# Patient Record
Sex: Male | Born: 1965 | Race: White | Hispanic: No | Marital: Married | Smoking: Never smoker
Health system: Southern US, Community
[De-identification: ages and names within clinical notes are randomized; demographics above are authoritative.]

---

## 2000-06-09 ENCOUNTER — Encounter: Payer: Self-pay | Admitting: Family Medicine

## 2000-06-09 ENCOUNTER — Encounter: Admission: RE | Admit: 2000-06-09 | Discharge: 2000-06-09 | Payer: Self-pay | Admitting: Family Medicine

## 2001-04-12 ENCOUNTER — Emergency Department (HOSPITAL_COMMUNITY): Admission: EM | Admit: 2001-04-12 | Discharge: 2001-04-12 | Payer: Self-pay | Admitting: Emergency Medicine

## 2002-09-15 ENCOUNTER — Encounter: Payer: Self-pay | Admitting: Family Medicine

## 2002-09-15 ENCOUNTER — Ambulatory Visit (HOSPITAL_COMMUNITY): Admission: RE | Admit: 2002-09-15 | Discharge: 2002-09-15 | Payer: Self-pay | Admitting: Family Medicine

## 2003-08-09 ENCOUNTER — Encounter: Admission: RE | Admit: 2003-08-09 | Discharge: 2003-08-09 | Payer: Self-pay | Admitting: Cardiology

## 2005-08-27 IMAGING — CR DG CHEST 2V
2 series · 2 of 2 positions shown · non-contrast
Comparison: none

CLINICAL DATA: Chest pain.
 CHEST X-RAY
 The heart size and mediastinal contours are unremarkable.  The lungs are clear.  The visualized skeleton is unremarkable.
 IMPRESSION
 No active lung disease.

[view not recorded (1 of 2)]
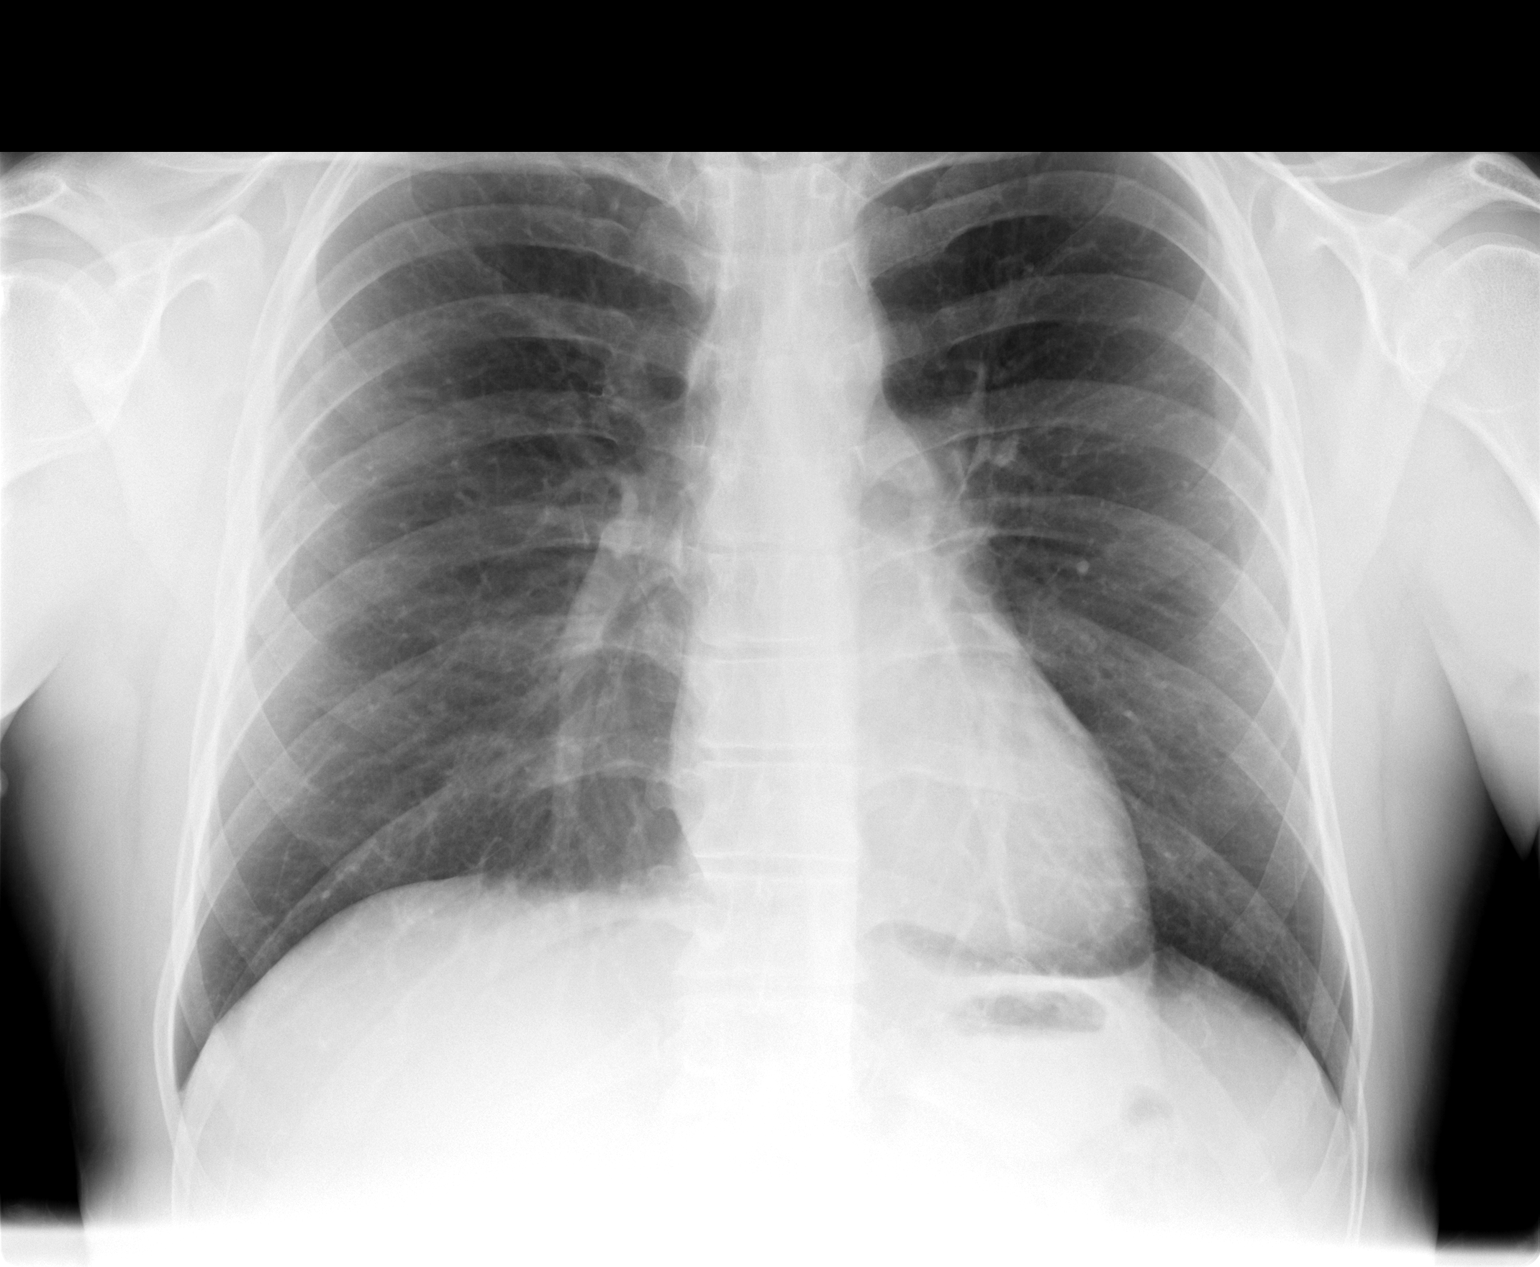

[view not recorded (2 of 2)]
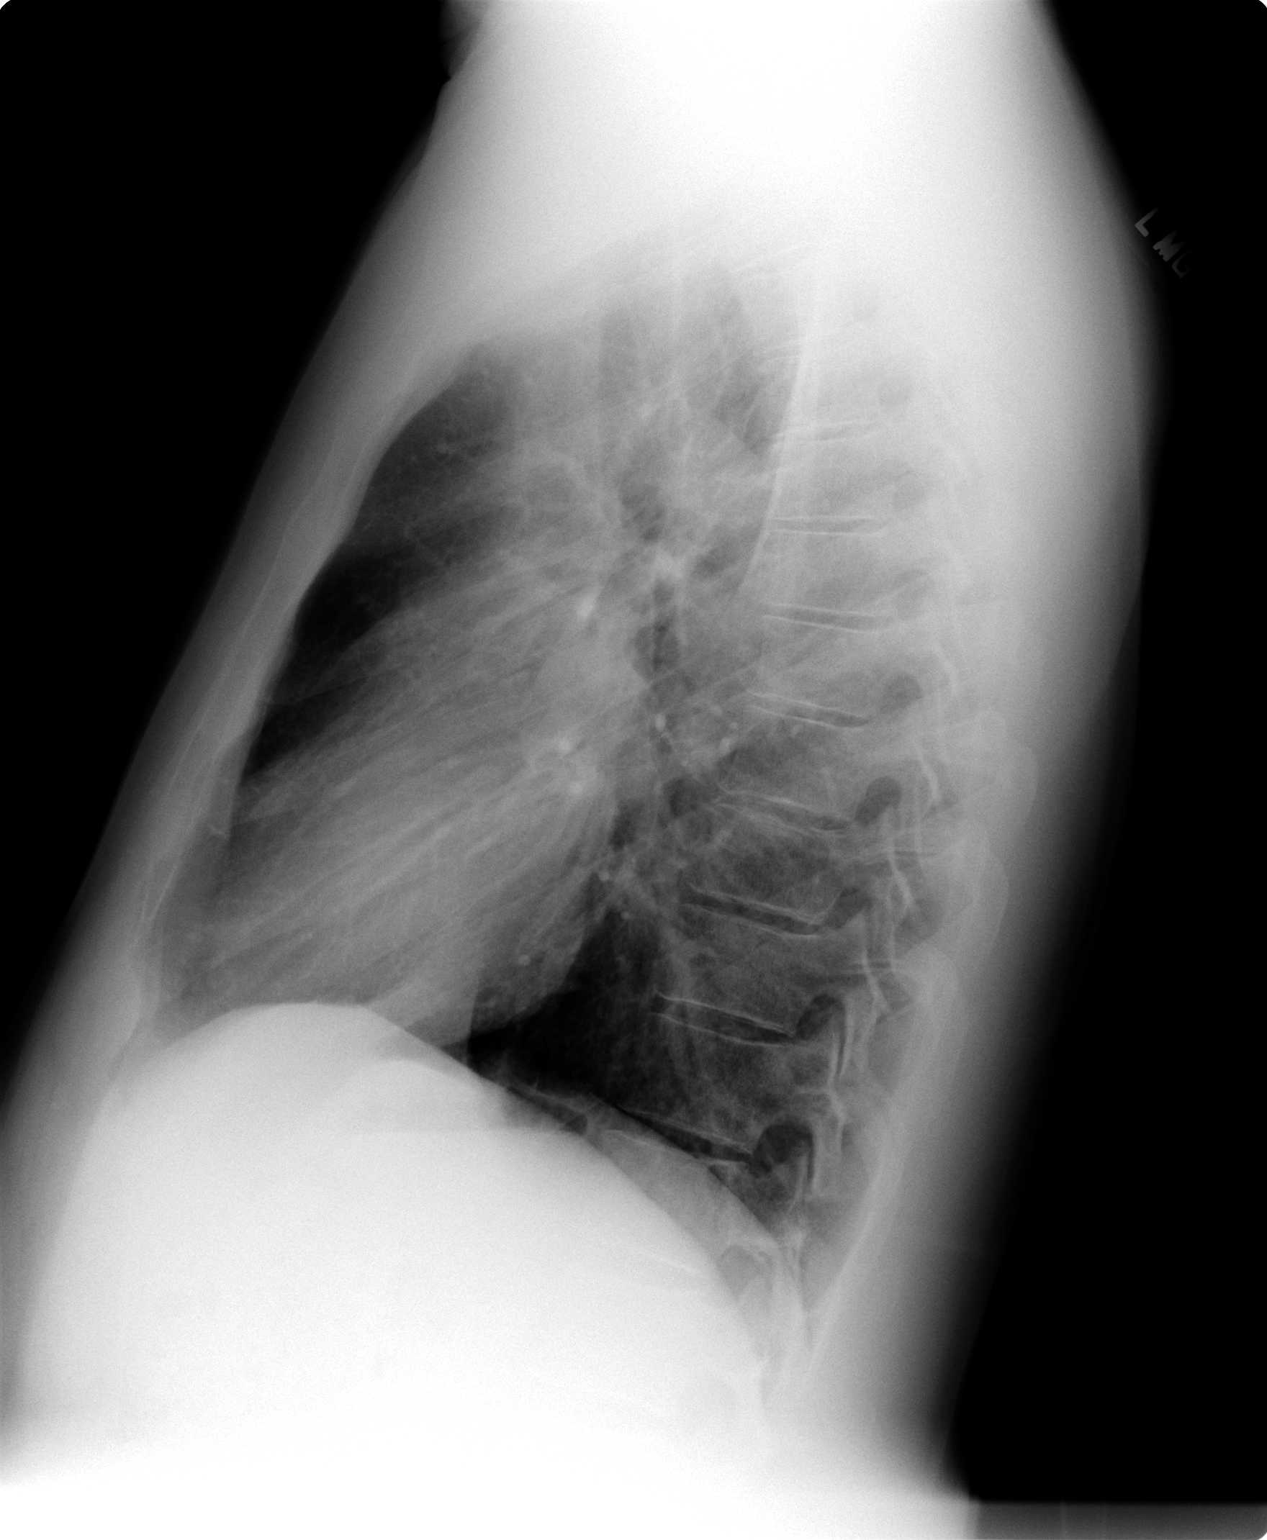

[2 of 2 positions shown; findings below may reference images not displayed]

## 2006-06-27 ENCOUNTER — Ambulatory Visit: Payer: Self-pay | Admitting: Gastroenterology

## 2006-07-02 ENCOUNTER — Ambulatory Visit: Payer: Self-pay | Admitting: Gastroenterology

## 2006-07-02 ENCOUNTER — Encounter: Payer: Self-pay | Admitting: Gastroenterology

## 2006-08-15 ENCOUNTER — Ambulatory Visit: Payer: Self-pay | Admitting: Gastroenterology

## 2007-07-18 DIAGNOSIS — K449 Diaphragmatic hernia without obstruction or gangrene: Secondary | ICD-10-CM | POA: Insufficient documentation

## 2007-07-18 DIAGNOSIS — J329 Chronic sinusitis, unspecified: Secondary | ICD-10-CM | POA: Insufficient documentation

## 2007-07-18 DIAGNOSIS — F17200 Nicotine dependence, unspecified, uncomplicated: Secondary | ICD-10-CM | POA: Insufficient documentation

## 2007-07-18 DIAGNOSIS — K589 Irritable bowel syndrome without diarrhea: Secondary | ICD-10-CM | POA: Insufficient documentation

## 2007-07-18 DIAGNOSIS — K219 Gastro-esophageal reflux disease without esophagitis: Secondary | ICD-10-CM | POA: Insufficient documentation

## 2007-07-18 DIAGNOSIS — J309 Allergic rhinitis, unspecified: Secondary | ICD-10-CM | POA: Insufficient documentation

## 2007-07-18 DIAGNOSIS — K222 Esophageal obstruction: Secondary | ICD-10-CM | POA: Insufficient documentation

## 2007-11-06 ENCOUNTER — Telehealth: Payer: Self-pay | Admitting: Gastroenterology

## 2008-01-11 ENCOUNTER — Telehealth: Payer: Self-pay | Admitting: Gastroenterology

## 2008-01-26 ENCOUNTER — Ambulatory Visit: Payer: Self-pay | Admitting: Gastroenterology

## 2010-07-20 NOTE — Assessment & Plan Note (Signed)
[facility]                         GASTROENTEROLOGY OFFICE NOTE   NAME:Opheim, ELIN SEATS                      MRN:          [id number]  DATE:06/27/2006                            DOB:          10-01-1965    Mr. Gillie is a 45 year old white male who is having chronic gas,  bloating, belching, and burping on and off since 2005.  He saw Dr. Victorino Dike in 2005 and was felt to have irritable bowel syndrome, after  ultrasound was negative and stool exams were normal.  At that time he  was having some diarrhea.  His symptoms seemed to go away and returned  within the last few weeks.  His main complaint is one of belching  without acid reflux symptoms or dysphagia.  His belches have an awful  smell and only occur during the day.  He has not tried antacids, H2  blockers or PPI therapy.  He denies any hepatobiliary complaints,  anorexia or weight loss.  He is having regular bowel movements now  without melena or hematochezia.  He has kept a food diary and does not  have any specific food intolerances.  He denies the abuse of alcohol,  cigarettes, or sodas.  He does eat very irregularly however.  He has had  no anorexia or weight loss.  Previous ultrasound exam was apparently  normal although I do not have this report.   PAST MEDICAL HISTORY:  Otherwise is entirely normal except for chronic  sinusitis and postnasal drip.   MEDICATIONS:  None.   ALLERGIES:  None.   FAMILY HISTORY:  Noncontributory.   SOCIAL HISTORY:  He is married and lives with his wife and 2 children.  He has a high school education and works as a Psychologist, occupational.  He does not smoke  and does not abuse ethanol.   REVIEW OF SYSTEMS:  Entirely non-contributory without any symptoms of  collagen vascular disease and no systemic complaints.  He feels well and  has no chronic fatigue.   He is a healthy appearing white male in no distress, appearing his  stated age.  I cannot appreciate stigmata  of chronic liver disease.  He is 6 feet tall and weighs 194 pounds.  Blood pressure 130/86, pulse  was 80 and regular.  I could not appreciate thyromegaly  CHEST:  Was clear.  He is in a regular rhythm without murmurs, gallops or rubs.  I could not appreciate hepatosplenomegaly, abdominal masses or  tenderness.  Bowel sounds were normal.  EXTREMITIES: Were unremarkable.  Mental status was normal.  RECTAL:  Exam was deferred.   ASSESSMENT:  Mr. Stubblefield has aerophagia and excessive belching of  unexplained etiology.  He probably has a hiatal hernia, probably has  occult gastroesophageal reflux disease.   RECOMMENDATIONS:  I am going to go ahead and set [facility] up for endoscopy  and proceed accordingly. Should this be unremarkable, will proceed with  technetium gastric emptying scan.  I have given him some information  concerning gas and its management in the interim     Vania Rea. Jarold Motto, MD, Caleen Essex, FAGA  Electronically Signed    DRP/MedQ  DD: 06/27/2006  DT: 06/27/2006  Job #: [id number]   cc:   Molly Maduro A. Nicholos Johns, M.D.

## 2013-08-08 ENCOUNTER — Ambulatory Visit: Payer: BC Managed Care – PPO

## 2013-08-08 ENCOUNTER — Ambulatory Visit (INDEPENDENT_AMBULATORY_CARE_PROVIDER_SITE_OTHER): Payer: BC Managed Care – PPO | Admitting: Family Medicine

## 2013-08-08 VITALS — BP 132/82 | HR 92 | Temp 99.2°F | Resp 16 | Ht 72.0 in | Wt 215.0 lb

## 2013-08-08 DIAGNOSIS — M25532 Pain in left wrist: Secondary | ICD-10-CM

## 2013-08-08 DIAGNOSIS — M25539 Pain in unspecified wrist: Secondary | ICD-10-CM

## 2013-08-08 DIAGNOSIS — S62102A Fracture of unspecified carpal bone, left wrist, initial encounter for closed fracture: Secondary | ICD-10-CM

## 2013-08-08 DIAGNOSIS — S62109A Fracture of unspecified carpal bone, unspecified wrist, initial encounter for closed fracture: Secondary | ICD-10-CM

## 2013-08-08 NOTE — Progress Notes (Signed)
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 He's had pain and some swelling ever since with difficulty moving the wrist at all. He says he can move his fingers without problem however. There are  There are no other complaints. No loss of consciousness or other injury. Patient is managing well controlling the pain with ibuprofen the  Objective: Alert and in no acute distress. Patient had bought a wrist splint and takes it off for inspection. Patient is unable to completely supinate the wrist or dorsi or palmar flex the wrist. The There is some mild swelling along the joint line of the left wrist with no ecchymosis or point tenderness.  [facility] reading (PRIMARY) by  Dr. Milus Glazier left wrist:.ND left wrist fx  Wrist pain, left - Plan: DG Wrist Complete Left, Ambulatory referral to Orthopedic Surgery  Wrist fracture, left - Plan: Ambulatory referral to Orthopedic Surgery  Signed, Elvina Sidle, MD

## 2015-08-27 IMAGING — CR DG WRIST COMPLETE 3+V*L*
2 series · 2 of 2 positions shown · non-contrast
Comparison: None.

CLINICAL DATA: Pain post trauma

EXAM:
LEFT WRIST - COMPLETE 3+ VIEW

[PA]
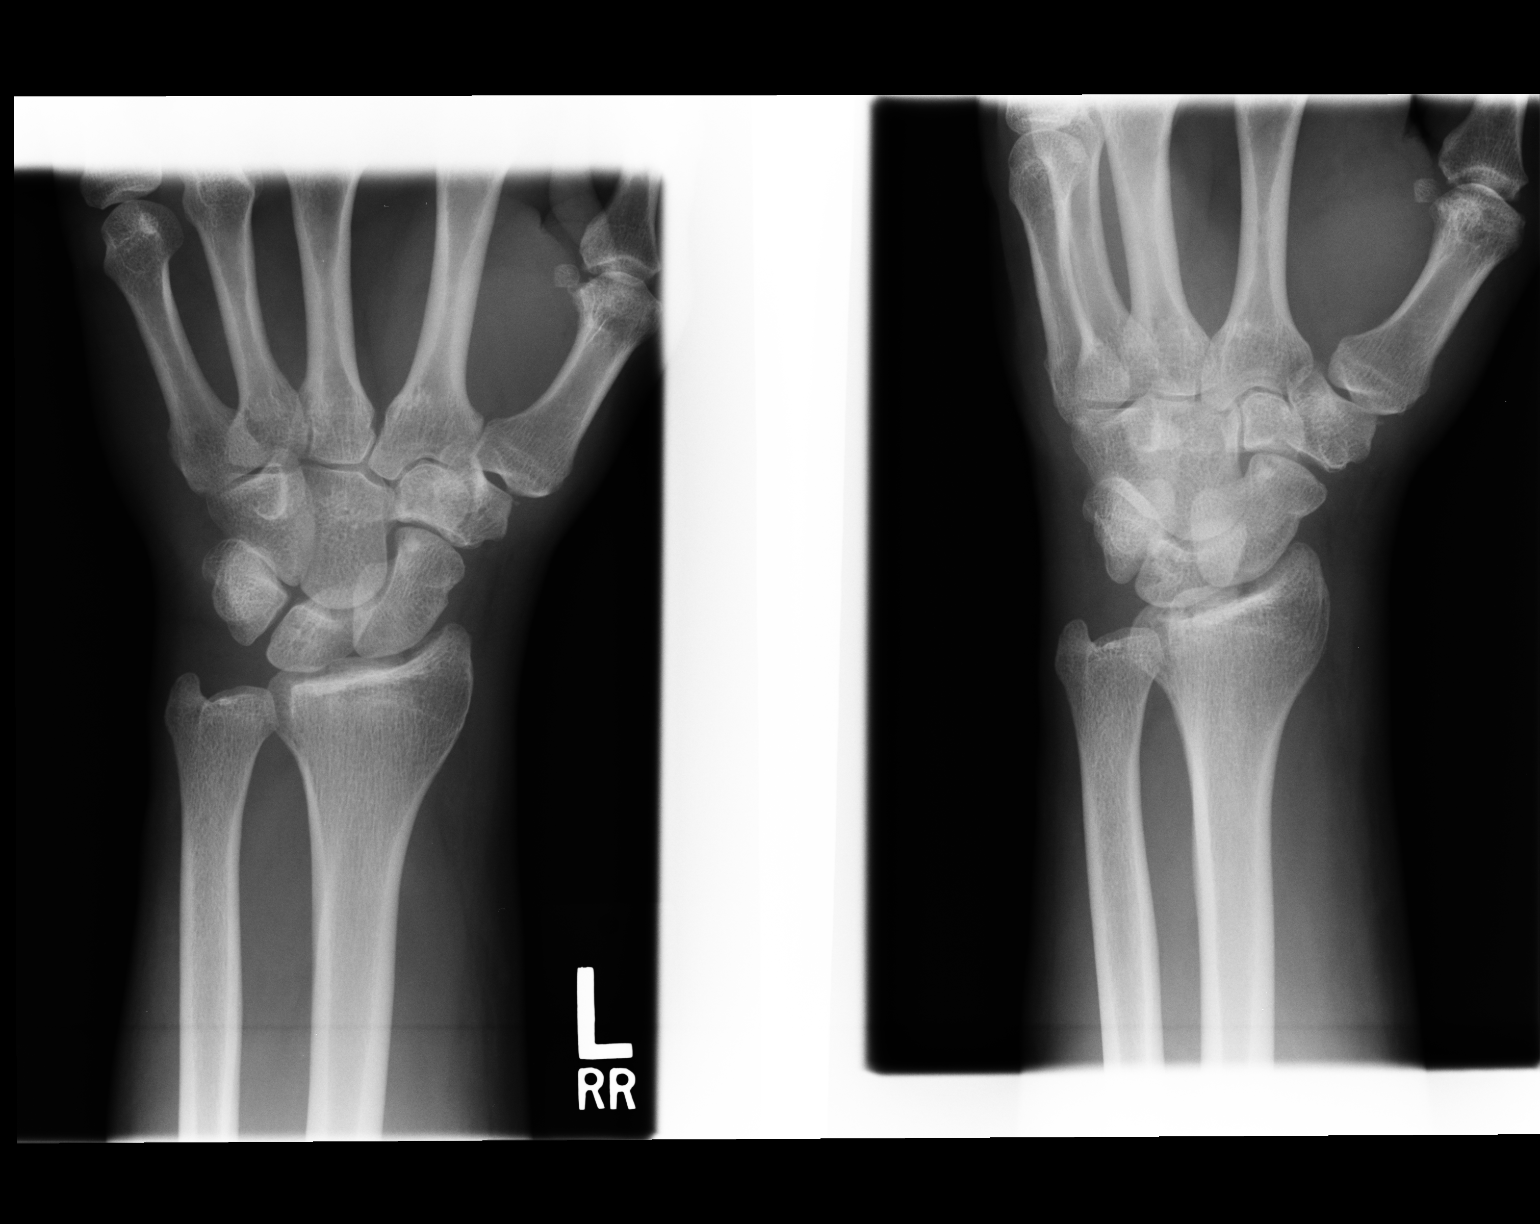

[lateral]
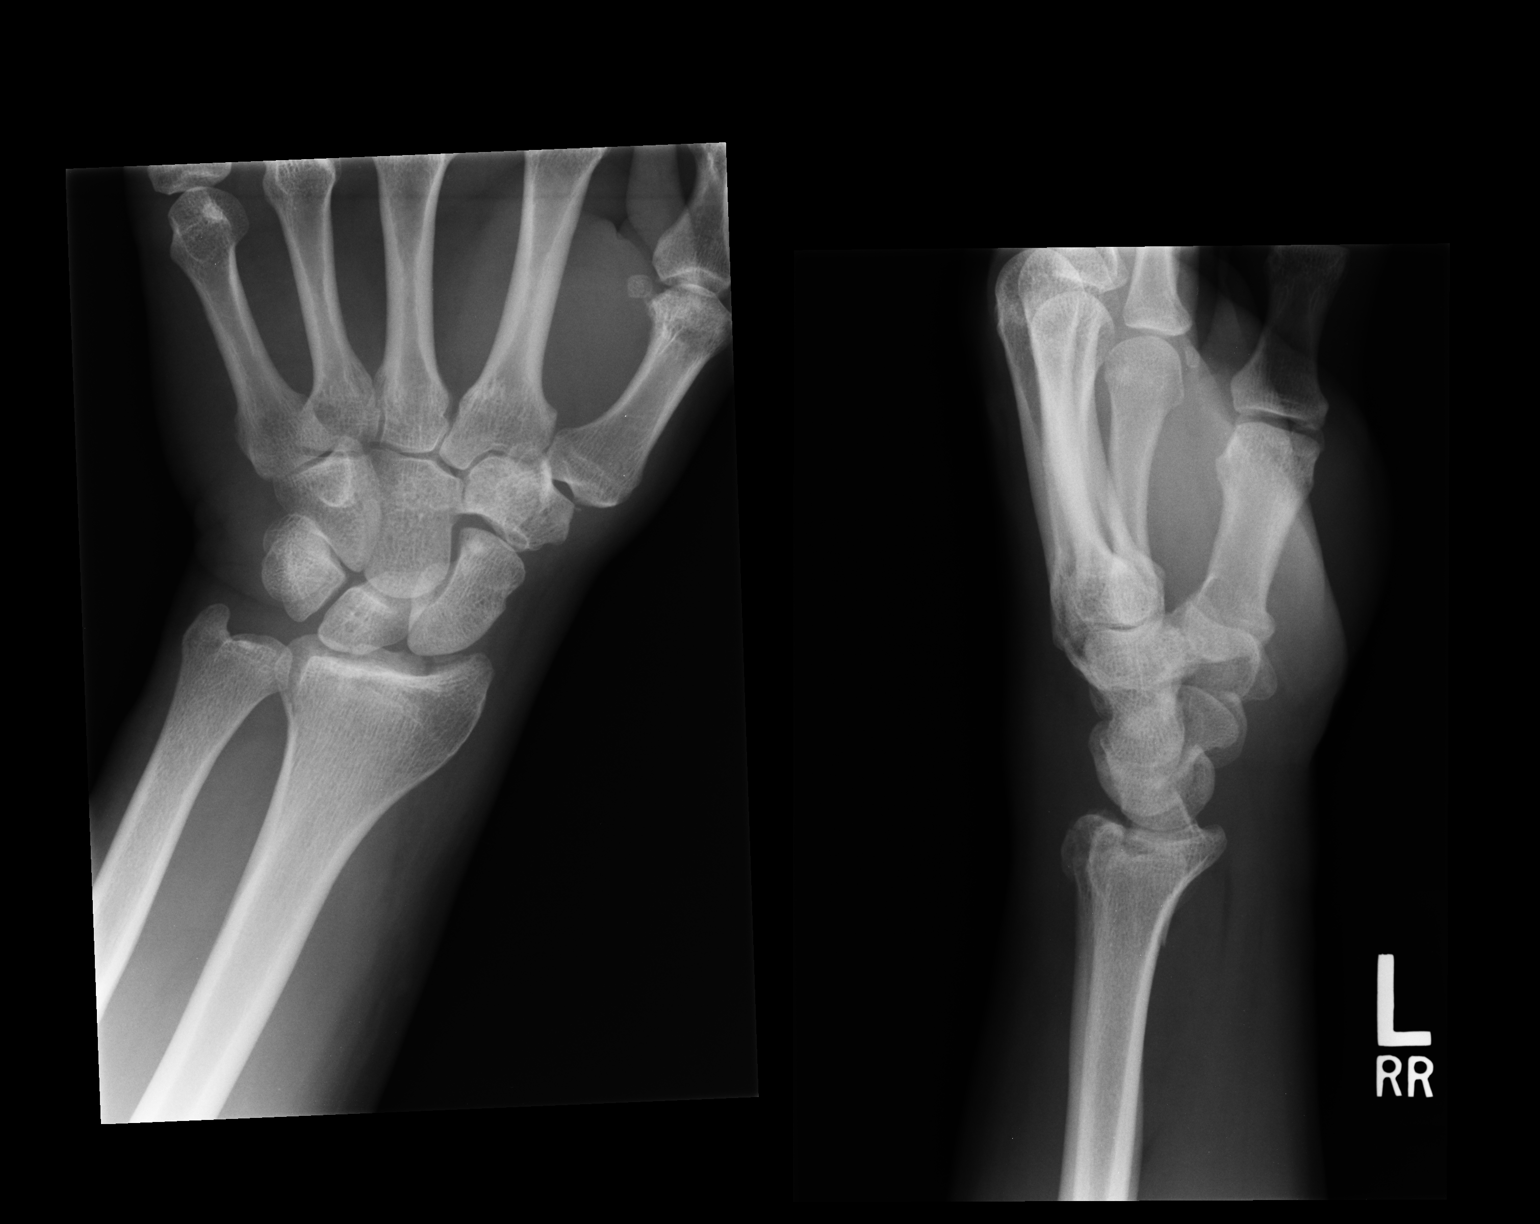

[2 of 2 positions shown; findings below may reference images not displayed]

FINDINGS: Frontal, lateral, oblique, and ulnar deviation scaphoid views were
obtained. There is a somewhat complex fracture along the dorsal
distal radial epiphysis extending into the radiocarpal joint along
the dorsal aspect. Alignment is overall near anatomic. No other
fractures. No dislocation. Joint spaces appear intact.
IMPRESSION: Complex distal radial fracture along the dorsal aspect with fracture
fragment extending into the radiocarpal joint. Alignment is near
anatomic. No dislocation.

## 2016-06-28 DIAGNOSIS — R7303 Prediabetes: Secondary | ICD-10-CM | POA: Diagnosis not present

## 2016-06-28 DIAGNOSIS — Z125 Encounter for screening for malignant neoplasm of prostate: Secondary | ICD-10-CM | POA: Diagnosis not present

## 2016-06-28 DIAGNOSIS — Z0000 Encounter for general adult medical examination without abnormal findings: Secondary | ICD-10-CM | POA: Diagnosis not present

## 2016-06-28 DIAGNOSIS — E7800 Pure hypercholesterolemia, unspecified: Secondary | ICD-10-CM | POA: Diagnosis not present

## 2017-11-10 DIAGNOSIS — I10 Essential (primary) hypertension: Secondary | ICD-10-CM | POA: Diagnosis not present

## 2017-11-10 DIAGNOSIS — K219 Gastro-esophageal reflux disease without esophagitis: Secondary | ICD-10-CM | POA: Diagnosis not present

## 2017-11-10 DIAGNOSIS — R7303 Prediabetes: Secondary | ICD-10-CM | POA: Diagnosis not present

## 2017-11-10 DIAGNOSIS — E7800 Pure hypercholesterolemia, unspecified: Secondary | ICD-10-CM | POA: Diagnosis not present

## 2019-05-07 DIAGNOSIS — I10 Essential (primary) hypertension: Secondary | ICD-10-CM | POA: Diagnosis not present

## 2019-05-07 DIAGNOSIS — K219 Gastro-esophageal reflux disease without esophagitis: Secondary | ICD-10-CM | POA: Diagnosis not present

## 2019-05-07 DIAGNOSIS — E7800 Pure hypercholesterolemia, unspecified: Secondary | ICD-10-CM | POA: Diagnosis not present

## 2019-05-07 DIAGNOSIS — R7303 Prediabetes: Secondary | ICD-10-CM | POA: Diagnosis not present

## 2019-08-03 DIAGNOSIS — Z03818 Encounter for observation for suspected exposure to other biological agents ruled out: Secondary | ICD-10-CM | POA: Diagnosis not present

## 2019-08-06 DIAGNOSIS — K5730 Diverticulosis of large intestine without perforation or abscess without bleeding: Secondary | ICD-10-CM | POA: Diagnosis not present

## 2019-08-06 DIAGNOSIS — K640 First degree hemorrhoids: Secondary | ICD-10-CM | POA: Diagnosis not present

## 2019-08-06 DIAGNOSIS — K635 Polyp of colon: Secondary | ICD-10-CM | POA: Diagnosis not present

## 2019-08-06 DIAGNOSIS — D125 Benign neoplasm of sigmoid colon: Secondary | ICD-10-CM | POA: Diagnosis not present

## 2019-08-06 DIAGNOSIS — Z1211 Encounter for screening for malignant neoplasm of colon: Secondary | ICD-10-CM | POA: Diagnosis not present

## 2021-04-24 DIAGNOSIS — M25511 Pain in right shoulder: Secondary | ICD-10-CM | POA: Diagnosis not present

## 2021-06-21 DIAGNOSIS — R399 Unspecified symptoms and signs involving the genitourinary system: Secondary | ICD-10-CM | POA: Diagnosis not present

## 2021-06-21 DIAGNOSIS — E781 Pure hyperglyceridemia: Secondary | ICD-10-CM | POA: Diagnosis not present

## 2021-06-21 DIAGNOSIS — Z125 Encounter for screening for malignant neoplasm of prostate: Secondary | ICD-10-CM | POA: Diagnosis not present

## 2021-06-21 DIAGNOSIS — Z0000 Encounter for general adult medical examination without abnormal findings: Secondary | ICD-10-CM | POA: Diagnosis not present

## 2021-06-21 DIAGNOSIS — K219 Gastro-esophageal reflux disease without esophagitis: Secondary | ICD-10-CM | POA: Diagnosis not present

## 2021-06-21 DIAGNOSIS — R7303 Prediabetes: Secondary | ICD-10-CM | POA: Diagnosis not present

## 2022-06-27 DIAGNOSIS — Z0000 Encounter for general adult medical examination without abnormal findings: Secondary | ICD-10-CM | POA: Diagnosis not present

## 2022-06-27 DIAGNOSIS — R7303 Prediabetes: Secondary | ICD-10-CM | POA: Diagnosis not present

## 2022-06-27 DIAGNOSIS — E782 Mixed hyperlipidemia: Secondary | ICD-10-CM | POA: Diagnosis not present

## 2022-06-27 DIAGNOSIS — Z125 Encounter for screening for malignant neoplasm of prostate: Secondary | ICD-10-CM | POA: Diagnosis not present

## 2023-07-02 DIAGNOSIS — H04123 Dry eye syndrome of bilateral lacrimal glands: Secondary | ICD-10-CM | POA: Diagnosis not present

## 2023-07-02 DIAGNOSIS — Z13220 Encounter for screening for lipoid disorders: Secondary | ICD-10-CM | POA: Diagnosis not present

## 2023-07-02 DIAGNOSIS — Z0000 Encounter for general adult medical examination without abnormal findings: Secondary | ICD-10-CM | POA: Diagnosis not present

## 2023-07-02 DIAGNOSIS — R7303 Prediabetes: Secondary | ICD-10-CM | POA: Diagnosis not present

## 2023-07-02 DIAGNOSIS — H9313 Tinnitus, bilateral: Secondary | ICD-10-CM | POA: Diagnosis not present

## 2023-07-02 DIAGNOSIS — Z125 Encounter for screening for malignant neoplasm of prostate: Secondary | ICD-10-CM | POA: Diagnosis not present

## 2023-07-10 ENCOUNTER — Encounter (INDEPENDENT_AMBULATORY_CARE_PROVIDER_SITE_OTHER): Payer: Self-pay | Admitting: Otolaryngology

## 2023-07-30 ENCOUNTER — Encounter (INDEPENDENT_AMBULATORY_CARE_PROVIDER_SITE_OTHER): Payer: Self-pay | Admitting: Otolaryngology

## 2023-09-11 DIAGNOSIS — M542 Cervicalgia: Secondary | ICD-10-CM | POA: Diagnosis not present

## 2023-10-06 ENCOUNTER — Ambulatory Visit (INDEPENDENT_AMBULATORY_CARE_PROVIDER_SITE_OTHER): Payer: Self-pay | Admitting: Audiology

## 2023-10-06 ENCOUNTER — Ambulatory Visit (INDEPENDENT_AMBULATORY_CARE_PROVIDER_SITE_OTHER): Payer: Self-pay | Admitting: Otolaryngology

## 2023-10-06 ENCOUNTER — Encounter (INDEPENDENT_AMBULATORY_CARE_PROVIDER_SITE_OTHER): Payer: Self-pay | Admitting: Otolaryngology

## 2023-10-06 VITALS — BP 145/89 | HR 74

## 2023-10-06 DIAGNOSIS — H903 Sensorineural hearing loss, bilateral: Secondary | ICD-10-CM | POA: Diagnosis not present

## 2023-10-06 DIAGNOSIS — H9313 Tinnitus, bilateral: Secondary | ICD-10-CM | POA: Diagnosis not present

## 2023-10-06 NOTE — Progress Notes (Signed)
  [address], [address] [place], [place] [address] [phone]  Audiological Evaluation    Name: Paul Trujillo     DOB:   30-Aug-1965      MRN:   [id number]                                                                                     Service Date: 10/06/2023     Accompanied by: unaccompanied   Patient comes today after Dr. Soldatova, ENT sent a referral for a hearing evaluation due to concerns with tinnitus.   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 Left ear:  Clear external ear canal and notable landmarks visualized on the tympanic membrane.  Tympanometry: Right ear: Type A- Normal external ear canal volume with normal middle ear pressure and tympanic membrane compliance. Left ear: Type A- Normal external ear canal volume with normal middle ear pressure and tympanic membrane compliance.    Pure tone Audiometry: Both ears: Normal to moderate sensorineural hearing loss from 125 Hz - 8000 Hz , with a high frequency hearing loss notch.  Speech Audiometry: Right ear- Speech Reception Threshold (SRT) was obtained at 15 dBHL. Left ear-Speech Reception Threshold (SRT) was obtained at 15 dBHL.   Word Recognition Score Tested using NU-6 (recorded) Right ear: 100% was obtained at a presentation level of 60 dBHL with contralateral masking which is deemed as  excellent. Left ear: 100% was obtained at a presentation level of 60 dBHL with contralateral masking which is deemed as  excellent.   The hearing test results were  completed under headphones and results are deemed to be of good reliability. Test technique:  conventional      Recommendations: Follow up with ENT as scheduled for today. Return for a hearing evaluation if concerns with hearing changes arise or per MD recommendation. Use hearing protection when exposed to loud/damaging sounds.    Emmaclaire Switala MARIE LEROUX-MARTINEZ, AUD

## 2023-10-06 NOTE — Progress Notes (Signed)
 ENT CONSULT:  Reason for Consult: bilateral tinnitus    HPI: 89 yoM with hx of noise exposure at work over the years and while attending concerts, who presents with b/l tinnitus described as ringing. No ear pain or difficulties with communication aside from situations with a lot of background noise. No ear surgeries, no ear infections. No ear fullness.   History reviewed. No pertinent family history.  Social History:  reports that he has never smoked. He has never used smokeless tobacco. He reports that he does not drink alcohol and does not use drugs.  Allergies: No Known Allergies  Medications: I have reviewed the patient's current medications.  The PMH, PSH, Medications, Allergies, and SH were reviewed and updated.  ROS: Constitutional: Negative for fever, weight loss and weight gain. Cardiovascular: Negative for chest pain and dyspnea on exertion. Respiratory: Is not experiencing shortness of breath at rest. Gastrointestinal: Negative for nausea and vomiting. Neurological: Negative for headaches. Psychiatric: The patient is not nervous/anxious  Blood pressure (!) 145/89, pulse 74, SpO2 96%.  PHYSICAL EXAM:  Exam: General: Well-developed, well-nourished Respiratory Respiratory effort: Equal inspiration and expiration without stridor Cardiovascular Peripheral Vascular: Warm extremities with equal color/perfusion Eyes: No nystagmus with equal extraocular motion bilaterally Neuro/Psych/Balance: Patient oriented to person, place, and time; Appropriate mood and affect; Gait is intact with no imbalance; Cranial nerves I-XII are intact Head and Face Inspection: Normocephalic and atraumatic without mass or lesion Palpation: Facial skeleton intact without bony stepoffs Salivary Glands: No mass or tenderness Facial Strength: Facial motility symmetric and full bilaterally ENT Pinna: External ear intact and fully developed External canal: Canal is patent with intact skin Tympanic  Membrane: Clear and mobile External Nose: No scar or anatomic deformity Internal Nose: Septum intact and midline. No edema, polyp, or rhinorrhea Lips, Teeth, and gums: Mucosa and teeth intact and viable TMJ: No pain to palpation with full mobility Oral cavity/oropharynx: No erythema or exudate, no lesions present Neck Neck and Trachea: Midline trachea without mass or lesion Thyroid: No mass or nodularity Lymphatics: No lymphadenopathy  Studies Reviewed: Audiogram today  Mild SNHL in high-freq only with a notch at 4K Hz, normal tymps AU WDS 100% AU  Assessment/Plan: Encounter Diagnoses  Name Primary?   Bilateral tinnitus Yes   Sensorineural hearing loss (SNHL) of both ears     Sensorineural Hearing Loss b/l and symmetric with tinnitus  Limited to high-freq only, likely noise-induced. Normal ear exam - hearing protection - annual Audiogram  Tinnitus - coping strategies d/w patient   Thank you for allowing me to participate in the care of this patient. Please do not hesitate to contact me with any questions or concerns.   Elena Larry, MD Otolaryngology [facility]   10/06/2023, 3:46 PM

## 2023-10-10 ENCOUNTER — Encounter: Payer: Self-pay | Admitting: Audiology
